# Patient Record
Sex: Male | Born: 1975 | Race: Black or African American | Hispanic: No | Marital: Married | Smoking: Never smoker
Health system: Southern US, Community
[De-identification: ages and names within clinical notes are randomized; demographics above are authoritative.]

## PROBLEM LIST (undated history)

## (undated) DIAGNOSIS — Z87442 Personal history of urinary calculi: Secondary | ICD-10-CM

## (undated) DIAGNOSIS — I10 Essential (primary) hypertension: Secondary | ICD-10-CM

## (undated) HISTORY — DX: Personal history of urinary calculi: Z87.442

## (undated) HISTORY — DX: Essential (primary) hypertension: I10

## (undated) HISTORY — PX: NO PAST SURGERIES: SHX2092

---

## 2008-03-09 ENCOUNTER — Emergency Department (HOSPITAL_COMMUNITY): Admission: EM | Admit: 2008-03-09 | Discharge: 2008-03-09 | Payer: Self-pay | Admitting: Emergency Medicine

## 2010-09-30 ENCOUNTER — Emergency Department (HOSPITAL_COMMUNITY)
Admission: EM | Admit: 2010-09-30 | Discharge: 2010-09-30 | Payer: Self-pay | Source: Home / Self Care | Admitting: Emergency Medicine

## 2010-09-30 LAB — DIFFERENTIAL
Basophils Absolute: 0.1 10*3/uL (ref 0.0–0.1)
Basophils Relative: 1 % (ref 0–1)
Eosinophils Absolute: 0.6 10*3/uL (ref 0.0–0.7)
Eosinophils Relative: 6 % — ABNORMAL HIGH (ref 0–5)
Lymphocytes Relative: 57 % — ABNORMAL HIGH (ref 12–46)
Monocytes Absolute: 0.6 10*3/uL (ref 0.1–1.0)
Monocytes Relative: 6 % (ref 3–12)
Neutro Abs: 3.1 10*3/uL (ref 1.7–7.7)
Neutrophils Relative %: 31 % — ABNORMAL LOW (ref 43–77)

## 2010-09-30 LAB — CBC
HCT: 41.4 % (ref 39.0–52.0)
Hemoglobin: 14.6 g/dL (ref 13.0–17.0)
MCH: 31.9 pg (ref 26.0–34.0)
MCHC: 35.3 g/dL (ref 30.0–36.0)
MCV: 90.4 fL (ref 78.0–100.0)
Platelets: 230 10*3/uL (ref 150–400)
RBC: 4.58 MIL/uL (ref 4.22–5.81)
RDW: 12.7 % (ref 11.5–15.5)
WBC: 10 10*3/uL (ref 4.0–10.5)

## 2010-09-30 LAB — BASIC METABOLIC PANEL
BUN: 13 mg/dL (ref 6–23)
CO2: 27 mEq/L (ref 19–32)
Calcium: 9.9 mg/dL (ref 8.4–10.5)
Chloride: 106 mEq/L (ref 96–112)
Creatinine, Ser: 0.86 mg/dL (ref 0.4–1.5)
GFR calc Af Amer: >60 mL/min (ref >60)
GFR calc non Af Amer: >60 mL/min (ref >60)
Glucose, Bld: 120 mg/dL — ABNORMAL HIGH (ref 70–99)
Potassium: 4.4 mEq/L (ref 3.5–5.1)
Sodium: 139 mEq/L (ref 135–145)

## 2010-09-30 LAB — URINALYSIS, ROUTINE W REFLEX MICROSCOPIC
Bilirubin Urine: NEGATIVE
Ketones, ur: NEGATIVE mg/dL
Leukocytes, UA: NEGATIVE
Nitrite: NEGATIVE
Protein, ur: NEGATIVE mg/dL
Specific Gravity, Urine: 1.013 (ref 1.005–1.030)
Urine Glucose, Fasting: NEGATIVE mg/dL
Urobilinogen, UA: 0.2 mg/dL (ref 0.0–1.0)
pH: 7 (ref 5.0–8.0)

## 2010-09-30 LAB — URINE MICROSCOPIC-ADD ON

## 2011-01-01 ENCOUNTER — Emergency Department (HOSPITAL_COMMUNITY): Payer: Self-pay

## 2011-01-01 ENCOUNTER — Emergency Department (HOSPITAL_COMMUNITY)
Admission: EM | Admit: 2011-01-01 | Discharge: 2011-01-01 | Disposition: A | Discharge status: Home / Self Care | Payer: Self-pay | Attending: Emergency Medicine | Admitting: Emergency Medicine

## 2011-01-01 DIAGNOSIS — I10 Essential (primary) hypertension: Secondary | ICD-10-CM | POA: Insufficient documentation

## 2011-01-01 DIAGNOSIS — N201 Calculus of ureter: Secondary | ICD-10-CM | POA: Insufficient documentation

## 2011-01-01 DIAGNOSIS — R109 Unspecified abdominal pain: Secondary | ICD-10-CM | POA: Insufficient documentation

## 2011-01-01 LAB — URINALYSIS, ROUTINE W REFLEX MICROSCOPIC
Bilirubin Urine: NEGATIVE
Glucose, UA: NEGATIVE mg/dL
Hgb urine dipstick: NEGATIVE
Ketones, ur: NEGATIVE mg/dL
Nitrite: NEGATIVE
Protein, ur: NEGATIVE mg/dL
Specific Gravity, Urine: 1.018 (ref 1.005–1.030)
Urobilinogen, UA: 1 mg/dL (ref 0.0–1.0)
pH: 7 (ref 5.0–8.0)

## 2011-01-01 LAB — DIFFERENTIAL
Basophils Absolute: 0 10*3/uL (ref 0.0–0.1)
Basophils Relative: 0 % (ref 0–1)
Eosinophils Absolute: 0 10*3/uL (ref 0.0–0.7)
Eosinophils Relative: 0 % (ref 0–5)
Lymphocytes Relative: 12 % (ref 12–46)
Lymphs Abs: 2 10*3/uL (ref 0.7–4.0)
Monocytes Absolute: 0.6 10*3/uL (ref 0.1–1.0)
Monocytes Relative: 4 % (ref 3–12)
Neutro Abs: 13.7 10*3/uL — ABNORMAL HIGH (ref 1.7–7.7)
Neutrophils Relative %: 84 % — ABNORMAL HIGH (ref 43–77)

## 2011-01-01 LAB — CBC
HCT: 44 % (ref 39.0–52.0)
Hemoglobin: 15.1 g/dL (ref 13.0–17.0)
MCH: 31.1 pg (ref 26.0–34.0)
MCHC: 34.3 g/dL (ref 30.0–36.0)
MCV: 90.7 fL (ref 78.0–100.0)
Platelets: 230 10*3/uL (ref 150–400)
RBC: 4.85 MIL/uL (ref 4.22–5.81)
RDW: 12.8 % (ref 11.5–15.5)
WBC: 16.4 10*3/uL — ABNORMAL HIGH (ref 4.0–10.5)

## 2011-01-01 LAB — COMPREHENSIVE METABOLIC PANEL
ALT: 35 U/L (ref 0–53)
AST: 39 U/L — ABNORMAL HIGH (ref 0–37)
Albumin: 4.3 g/dL (ref 3.5–5.2)
Alkaline Phosphatase: 61 U/L (ref 39–117)
BUN: 14 mg/dL (ref 6–23)
CO2: 24 mEq/L (ref 19–32)
Calcium: 9.6 mg/dL (ref 8.4–10.5)
Chloride: 104 mEq/L (ref 96–112)
Creatinine, Ser: 0.96 mg/dL (ref 0.4–1.5)
GFR calc Af Amer: >60 mL/min (ref >60)
GFR calc non Af Amer: >60 mL/min (ref >60)
Glucose, Bld: 117 mg/dL — ABNORMAL HIGH (ref 70–99)
Potassium: 5 mEq/L (ref 3.5–5.1)
Sodium: 137 mEq/L (ref 135–145)
Total Bilirubin: 1.2 mg/dL (ref 0.3–1.2)
Total Protein: 7.9 g/dL (ref 6.0–8.3)

## 2011-01-01 LAB — LIPASE, BLOOD: Lipase: 19 U/L (ref 11–59)

## 2011-06-15 ENCOUNTER — Ambulatory Visit (INDEPENDENT_AMBULATORY_CARE_PROVIDER_SITE_OTHER): Payer: PRIVATE HEALTH INSURANCE | Admitting: Internal Medicine

## 2011-06-15 ENCOUNTER — Encounter: Payer: Self-pay | Admitting: Internal Medicine

## 2011-06-15 DIAGNOSIS — I10 Essential (primary) hypertension: Secondary | ICD-10-CM

## 2011-06-15 DIAGNOSIS — N209 Urinary calculus, unspecified: Secondary | ICD-10-CM

## 2011-06-15 LAB — POCT URINALYSIS DIPSTICK
Bilirubin, UA: NEGATIVE
Blood, UA: NEGATIVE
Glucose, UA: NEGATIVE
Ketones, UA: NEGATIVE
Leukocytes, UA: NEGATIVE
Nitrite, UA: NEGATIVE
Protein, UA: NEGATIVE
Spec Grav, UA: 1.025
Urobilinogen, UA: 0.2
pH, UA: 5

## 2011-06-15 LAB — BASIC METABOLIC PANEL
BUN: 11 mg/dL (ref 6–23)
CO2: 24 mEq/L (ref 19–32)
Calcium: 10.1 mg/dL (ref 8.4–10.5)
Chloride: 106 mEq/L (ref 96–112)
Creatinine, Ser: 0.7 mg/dL (ref 0.4–1.5)
GFR: 163.29 mL/min (ref >60.00)
Glucose, Bld: 90 mg/dL (ref 70–99)
Potassium: 4.2 mEq/L (ref 3.5–5.1)
Sodium: 137 mEq/L (ref 135–145)

## 2011-06-15 LAB — PSA: PSA: 0.67 ng/mL (ref 0.10–4.00)

## 2011-06-15 MED ORDER — AMLODIPINE BESYLATE 5 MG PO TABS: 5.0000 mg | ORAL_TABLET | Freq: Every day | ORAL | Status: AC

## 2011-06-15 NOTE — Assessment & Plan Note (Addendum)
Reportedly DBP consistently in the 90s > 1 year rec amlodipine  5 mg, call if develops s/e Long term complications of poorly controlled BP discussed

## 2011-06-15 NOTE — Assessment & Plan Note (Addendum)
See HPI, recent urolithiasis now w/ residual pain. Physical exam normal Plan: Get records UA UCX BMP PSA U/s renal reassess in 6 weeks Sx area atypical ?musculo-skeletal

## 2011-06-15 NOTE — Patient Instructions (Signed)
Get records from urology

## 2011-06-15 NOTE — Progress Notes (Signed)
  Subjective:    Patient ID: Danny Bush, male    DOB: [birth year], 35 y.o.   MRN: [id number]  HPI New patient ~ 03-2011, was seen in the ER   with severe pain, was dx w/ urolithiasis, apparently had some obstruction; then saw a urology , they recommend observation. Eventually the stone passed, went to see urology for a f/u, CT was done, it was clear and pt released. Since then: Has on-off low back pain w/ occ radiation to the L buttock and the L testicle. Pain worse with bending  Also h/o mild but consistent elevated BP, pt reluctant to accept the dx (per wife)  Past Medical History  Diagnosis Date  . Hypertension     mild  . History of kidney stones    Past Surgical History  Procedure Date  . No past surgeries    History   Social History  . Marital Status: Single    Spouse Name: N/A    Number of Children: 1  . Years of Education: N/A   Occupational History  . forklift driver     Social History Main Topics  . Smoking status: Never Smoker   . Smokeless tobacco: Never Used  . Alcohol Use: No  . Drug Use: No  . Sexually Active: Not on file   Other Topics Concern  . Not on file   Social History Narrative   Patient and wife are from  [place]    Family History  Problem Relation Age of Onset  . Diabetes Neg Hx   . Coronary artery disease Neg Hx   . Cancer Neg Hx       Review of Systems No F/C No dysuria , gross hematuria, occ diff urinating occ nausea, no V-D, sometimes constipated  No testicular swelling     Objective:   Physical Exam  Constitutional: He is oriented to person, place, and time. He appears well-developed and well-nourished.  HENT:  Head: Normocephalic and atraumatic.  Cardiovascular: Normal rate, regular rhythm and normal heart sounds.   No murmur heard. Pulmonary/Chest: Effort normal and breath sounds normal. No respiratory distress. He has no wheezes. He has no rales.  Abdominal: Soft. Bowel sounds are normal. He exhibits no distension. There  is no tenderness. There is no rebound.       No CVA tenderness   Genitourinary: Rectum normal and prostate normal.       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 Testcles normal B. No inguinal hernias  Musculoskeletal: He exhibits no edema.  Neurological: He is alert and oriented to person, place, and time.  Psychiatric: He has a normal mood and affect. His behavior is normal. Judgment and thought content normal.          Assessment & Plan:

## 2011-06-17 LAB — CULTURE, URINE COMPREHENSIVE
Colony Count: NO GROWTH
Organism ID, Bacteria: NO GROWTH

## 2011-06-21 ENCOUNTER — Ambulatory Visit
Admission: RE | Admit: 2011-06-21 | Discharge: 2011-06-21 | Disposition: A | Discharge status: Home / Self Care | Payer: PRIVATE HEALTH INSURANCE | Source: Ambulatory Visit | Attending: Internal Medicine | Admitting: Internal Medicine

## 2011-06-21 DIAGNOSIS — N209 Urinary calculus, unspecified: Secondary | ICD-10-CM

## 2014-06-28 ENCOUNTER — Encounter (HOSPITAL_BASED_OUTPATIENT_CLINIC_OR_DEPARTMENT_OTHER): Payer: Self-pay | Admitting: Emergency Medicine

## 2014-06-28 ENCOUNTER — Emergency Department (HOSPITAL_BASED_OUTPATIENT_CLINIC_OR_DEPARTMENT_OTHER): Payer: PRIVATE HEALTH INSURANCE

## 2014-06-28 ENCOUNTER — Emergency Department (HOSPITAL_BASED_OUTPATIENT_CLINIC_OR_DEPARTMENT_OTHER)
Admission: EM | Admit: 2014-06-28 | Discharge: 2014-06-28 | Disposition: A | Discharge status: Home / Self Care | Payer: PRIVATE HEALTH INSURANCE | Attending: Emergency Medicine | Admitting: Emergency Medicine

## 2014-06-28 DIAGNOSIS — I10 Essential (primary) hypertension: Secondary | ICD-10-CM | POA: Diagnosis not present

## 2014-06-28 DIAGNOSIS — Z79899 Other long term (current) drug therapy: Secondary | ICD-10-CM | POA: Diagnosis not present

## 2014-06-28 DIAGNOSIS — N200 Calculus of kidney: Secondary | ICD-10-CM | POA: Diagnosis not present

## 2014-06-28 DIAGNOSIS — R109 Unspecified abdominal pain: Secondary | ICD-10-CM | POA: Diagnosis present

## 2014-06-28 LAB — COMPREHENSIVE METABOLIC PANEL
ALT: 28 U/L (ref 0–53)
AST: 21 U/L (ref 0–37)
Albumin: 4.1 g/dL (ref 3.5–5.2)
Alkaline Phosphatase: 61 U/L (ref 39–117)
Anion gap: 14 (ref 5–15)
BUN: 18 mg/dL (ref 6–23)
CO2: 23 mEq/L (ref 19–32)
Calcium: 11.3 mg/dL — ABNORMAL HIGH (ref 8.4–10.5)
Chloride: 104 mEq/L (ref 96–112)
Creatinine, Ser: 1 mg/dL (ref 0.50–1.35)
GFR calc Af Amer: >90 mL/min (ref >90)
GFR calc non Af Amer: >90 mL/min (ref >90)
Glucose, Bld: 131 mg/dL — ABNORMAL HIGH (ref 70–99)
Potassium: 3.8 mEq/L (ref 3.7–5.3)
Sodium: 141 mEq/L (ref 137–147)
Total Bilirubin: 0.3 mg/dL (ref 0.3–1.2)
Total Protein: 7.9 g/dL (ref 6.0–8.3)

## 2014-06-28 LAB — URINALYSIS, ROUTINE W REFLEX MICROSCOPIC
Bilirubin Urine: NEGATIVE
Glucose, UA: NEGATIVE mg/dL
Ketones, ur: NEGATIVE mg/dL
Leukocytes, UA: NEGATIVE
Nitrite: NEGATIVE
Protein, ur: NEGATIVE mg/dL
Specific Gravity, Urine: 1.014 (ref 1.005–1.030)
Urobilinogen, UA: 1 mg/dL (ref 0.0–1.0)
pH: 6.5 (ref 5.0–8.0)

## 2014-06-28 LAB — CBC
HCT: 38.4 % — ABNORMAL LOW (ref 39.0–52.0)
Hemoglobin: 14.1 g/dL (ref 13.0–17.0)
MCH: 31.8 pg (ref 26.0–34.0)
MCHC: 36.7 g/dL — ABNORMAL HIGH (ref 30.0–36.0)
MCV: 86.5 fL (ref 78.0–100.0)
Platelets: 219 10*3/uL (ref 150–400)
RBC: 4.44 MIL/uL (ref 4.22–5.81)
RDW: 12.1 % (ref 11.5–15.5)
WBC: 12 10*3/uL — ABNORMAL HIGH (ref 4.0–10.5)

## 2014-06-28 LAB — URINE MICROSCOPIC-ADD ON

## 2014-06-28 MED ORDER — HYDROCODONE-ACETAMINOPHEN 5-325 MG PO TABS: 1.0000 | ORAL_TABLET | Freq: Four times a day (QID) | ORAL | Status: AC | PRN

## 2014-06-28 MED ORDER — ONDANSETRON HCL 4 MG/2ML IJ SOLN
4.0000 mg | Freq: Once | INTRAMUSCULAR | Status: AC
  Administered 2014-06-28: 4 mg via INTRAVENOUS
  Filled 2014-06-28: qty 2

## 2014-06-28 MED ORDER — HYDROMORPHONE HCL 1 MG/ML IJ SOLN
1.0000 mg | Freq: Once | INTRAMUSCULAR | Status: AC
  Administered 2014-06-28: 1 mg via INTRAVENOUS
  Filled 2014-06-28: qty 1

## 2014-06-28 MED ORDER — TAMSULOSIN HCL 0.4 MG PO CAPS: 0.4000 mg | ORAL_CAPSULE | Freq: Every day | ORAL | Status: DC

## 2014-06-28 MED ORDER — KETOROLAC TROMETHAMINE 30 MG/ML IJ SOLN
30.0000 mg | Freq: Once | INTRAMUSCULAR | Status: AC
  Administered 2014-06-28: 30 mg via INTRAVENOUS
  Filled 2014-06-28: qty 1

## 2014-06-28 MED ORDER — SODIUM CHLORIDE 0.9 % IV BOLUS (SEPSIS)
1000.0000 mL | Freq: Once | INTRAVENOUS | Status: AC
  Administered 2014-06-28: 1000 mL via INTRAVENOUS

## 2014-06-28 NOTE — Discharge Instructions (Signed)

## 2014-06-28 NOTE — ED Provider Notes (Signed)
CSN: [id number]     Arrival date & time 06/28/14  1547 History  This chart was scribed for Elwin MochaBlair Cerenity Goshorn, MD by Evon Slackerrance Branch, ED Scribe. This patient was seen in room MH12/MH12 and the patient's care was started at 3:52 PM.     Chief Complaint  Patient presents with  . Abdominal Pain  . Groin Pain  . Testicle Pain   Patient is a 38 y.o. male presenting with abdominal pain, groin pain, and testicular pain. The history is provided by the patient. No language interpreter was used.  Abdominal Pain Pain location:  R flank Pain radiates to:  Scrotum Pain severity:  Severe Onset quality:  Sudden Duration:  1 hour Timing:  Constant Relieved by:  None tried Worsened by:  Nothing tried Ineffective treatments:  None tried Associated symptoms: dysuria   Groin Pain Associated symptoms include abdominal pain.  Testicle Pain Associated symptoms include abdominal pain.   HPI Comments: Danny Bush is a 38 y.o. male who presents to the Emergency Department complaining of constant abdominal pain onset 1 hour prior. He states that the pain radiates down into his right testicle. He states he has associated back pain and dysuria. He states he tried to urinate on arrival but was unable to urinate due to the pain.  He states he has had similar pain 1 year ago and was diagnosed with a kidney stone.   Past Medical History  Diagnosis Date  . Hypertension     mild  . History of kidney stones    Past Surgical History  Procedure Laterality Date  . No past surgeries     Family History  Problem Relation Age of Onset  . Diabetes Neg Hx   . Coronary artery disease Neg Hx   . Cancer Neg Hx    History  Substance Use Topics  . Smoking status: Never Smoker   . Smokeless tobacco: Never Used  . Alcohol Use: No    Review of Systems  Gastrointestinal: Positive for abdominal pain.  Genitourinary: Positive for dysuria and testicular pain.  Musculoskeletal: Positive for back pain.  All other systems  reviewed and are negative.  Allergies  Review of patient's allergies indicates no known allergies.  Home Medications   Prior to Admission medications   Medication Sig Start Date End Date Taking? Authorizing Provider  amLODipine (NORVASC) 5 MG tablet Take 1 tablet (5 mg total) by mouth daily. 06/15/11 06/14/12  Wanda PlumpJose E Paz, MD   Triage Vitals: BP 183/119  Pulse 85  Temp(Src) 98.7 F (37.1 C) (Oral)  Resp 20  SpO2 100%   Physical Exam  Nursing note and vitals reviewed. Constitutional: He is oriented to person, place, and time. He appears well-developed and well-nourished.  Patient writhing around in pain  HENT:  Head: Normocephalic and atraumatic.  Right Ear: External ear normal.  Left Ear: External ear normal.  Nose: Nose normal.  Mouth/Throat: Oropharynx is clear and moist.  Eyes: Conjunctivae and EOM are normal. Pupils are equal, round, and reactive to light.  Neck: Normal range of motion. Neck supple.  Cardiovascular: Normal rate, regular rhythm, normal heart sounds and intact distal pulses.   Pulmonary/Chest: Effort normal and breath sounds normal. No respiratory distress. He has no wheezes.  Abdominal: Soft. Bowel sounds are normal. He exhibits no distension. There is tenderness (R flank, RL:Q). There is no rebound and no guarding. Hernia confirmed negative in the right inguinal area and confirmed negative in the left inguinal area.  Genitourinary: Right testis shows  tenderness (very mild). Right testis shows no mass and no swelling. Left testis shows no mass, no swelling and no tenderness.  Musculoskeletal: Normal range of motion.  Lymphadenopathy:       Right: No inguinal adenopathy present.       Left: No inguinal adenopathy present.  Neurological: He is alert and oriented to person, place, and time. He has normal reflexes.  Skin: Skin is warm and dry. No rash noted.  Psychiatric: He has a normal mood and affect. His behavior is normal. Judgment and thought content  normal.    ED Course  Procedures (including critical care time) DIAGNOSTIC STUDIES: Oxygen Saturation is 100% on RA, normal by my interpretation.    COORDINATION OF CARE: 3:59 PM-Discussed treatment plan which includes pain medication, IV fluid and zofran with pt at bedside and pt agreed to plan.     Labs Review Labs Reviewed - No data to display  Imaging Review Ct Renal Stone Study  06/28/2014   CLINICAL DATA:  Right flank and scrotal pain for 2 hr. Dysuria. No history of trauma.  EXAM: CT ABDOMEN AND PELVIS WITHOUT CONTRAST  TECHNIQUE: Multidetector CT imaging of the abdomen and pelvis was performed following the standard protocol without IV contrast.  COMPARISON:  CT abdomen and pelvis 01/01/2011 and 09/30/2010.  FINDINGS: Two subcentimeter nodules are seen along the left major fissure on image 12, unchanged. There is also some dependent atelectasis in the bases. No pleural or pericardial effusion.  There is mild stranding about the right ureter and very mild right hydronephrosis due to a 0.2 cm stone at the right UVJ. No other urinary tract stones are seen on the right or left.  The gallbladder, liver, spleen, adrenal glands, pancreas and biliary tree appear normal. Small umbilical hernia contains a knuckle of small bowel without obstruction or other complicating feature. The small bowel is otherwise unremarkable. The stomach, colon and appendix appear normal. No lymphadenopathy or fluid. No focal bony abnormality.  IMPRESSION: Mild right hydronephrosis due to a 0.2 cm stone at the right UVJ.  Small umbilical hernia contains a knuckle of small bowel without obstruction or other complicating feature.   Electronically Signed   By: Drusilla Kanner M.D.   On: 06/28/2014 16:40     EKG Interpretation None      MDM   Final diagnoses:  Kidney stone   34M with acute onset of R flank pain. Off and on this morning, severe episode starting today. Hx of large kidney stones, feels like similar.   No vomiting, no fevers. On exam, R flank pain. GU exam with very mild R testicular pain. Doubt torsion as he said pain starts in the abdomen and radiates down into the R testicle and has hx of kidney stones.  Will start with labs, CT scan, fluids, pain meds. Scan shows kidney stone - 2mm. Stable for discharge.  I have reviewed all labs and imaging and considered them in my medical decision making.   I personally performed the services described in this documentation, which was scribed in my presence. The recorded information has been reviewed and is accurate.        Elwin Mocha, MD 06/28/14 [phone]

## 2014-06-28 NOTE — ED Notes (Signed)
Patient presents to ED with complaints of rt lower quadrant pain groin pain testicular pain and penis pain that started 2 hours ago. Pt thrashing around during triage, laying in floor.

## 2015-06-22 IMAGING — CT CT RENAL STONE PROTOCOL
2 of 4 series · 15 of 46 positions shown, 17 images · non-contrast
Comparison: CT abdomen and pelvis 01/01/2011 and 09/30/2010.

CLINICAL DATA: Right flank and scrotal pain for 2 hr. Dysuria. No
history of trauma.

EXAM:
CT ABDOMEN AND PELVIS WITHOUT CONTRAST
TECHNIQUE: Multidetector CT imaging of the abdomen and pelvis was performed
following the standard protocol without IV contrast.

[Series 2: renal stone < 200 lbs 5.0 b31f · axial · 0.76mm/px · z∈[-516,-36]mm · 12 of 106 slices shown, 14 images]
[im 5/106  soft-tissue]
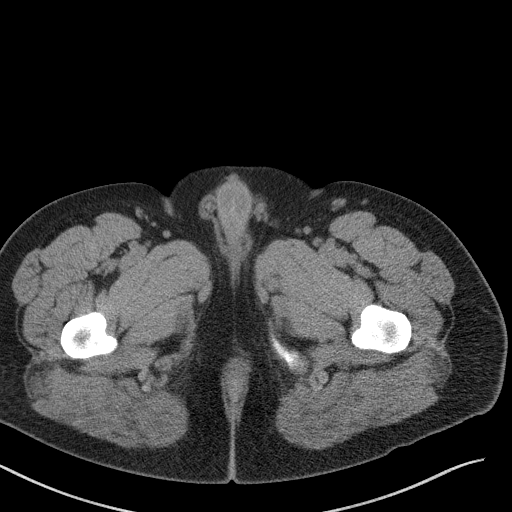
[im 5/106  bone]
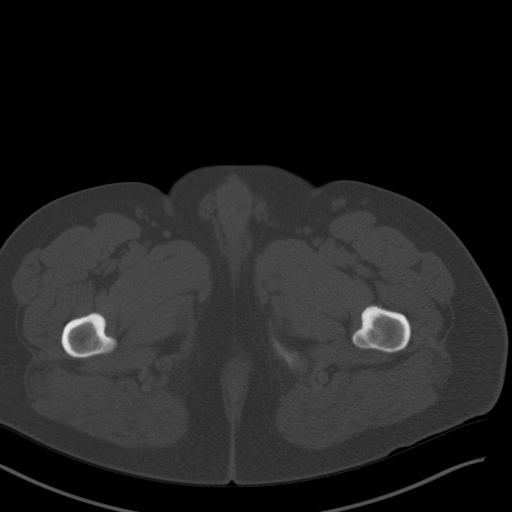
[im 14/106  soft-tissue]
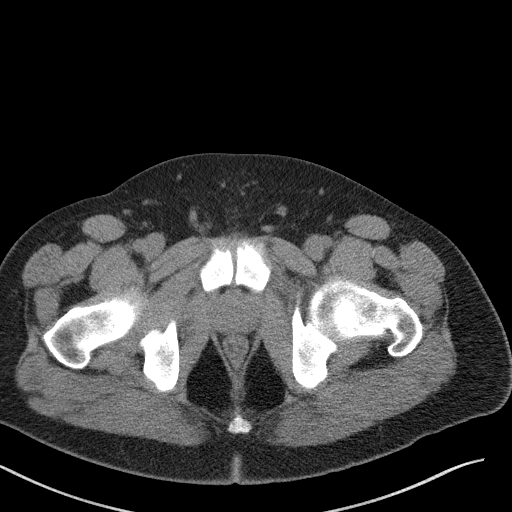
[im 22/106  soft-tissue]
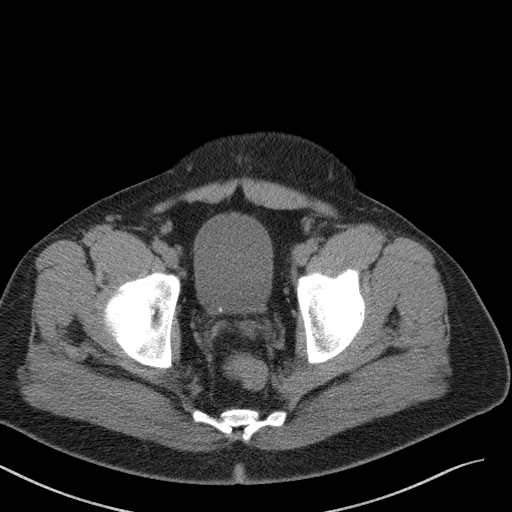
[im 31/106  soft-tissue]
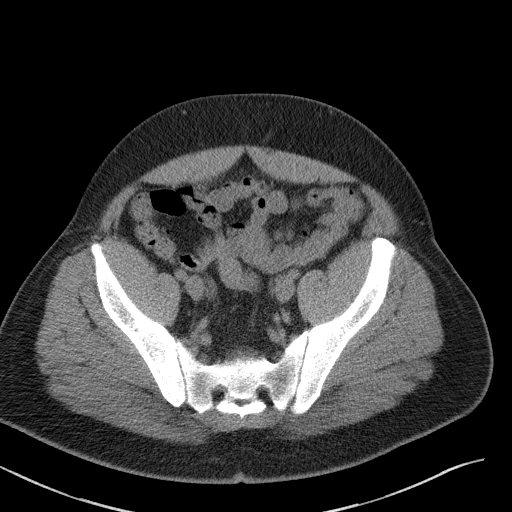
[im 40/106  soft-tissue]
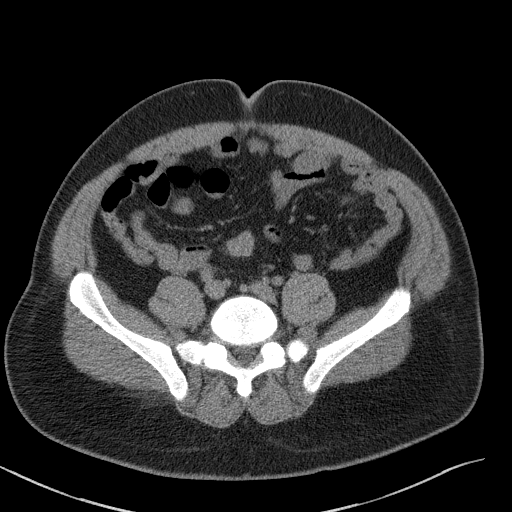
[im 49/106  soft-tissue]
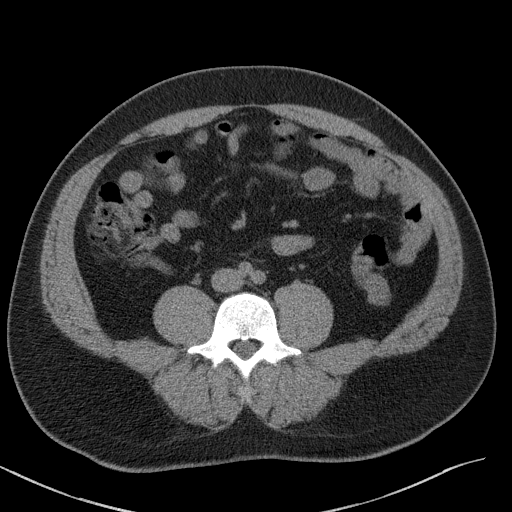
[im 57/106  soft-tissue]
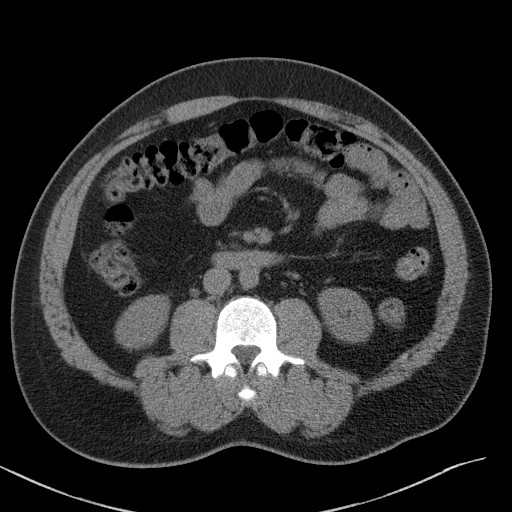
[im 66/106  soft-tissue]
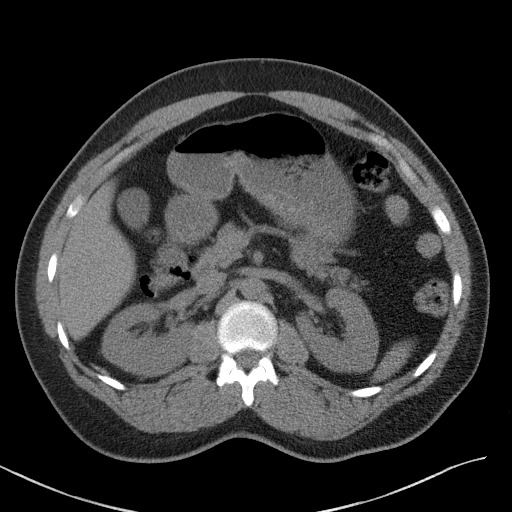
[im 75/106  soft-tissue]
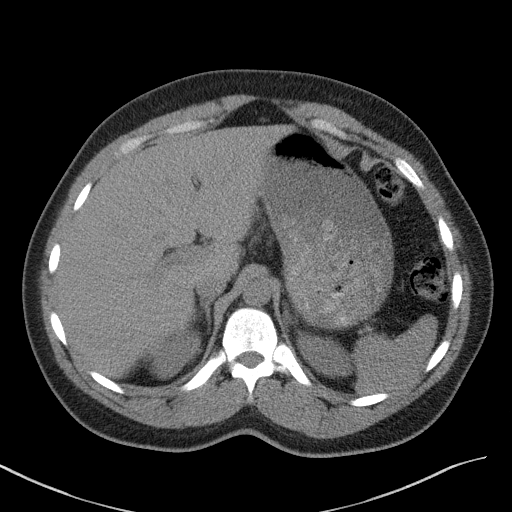
[im 75/106  bone]
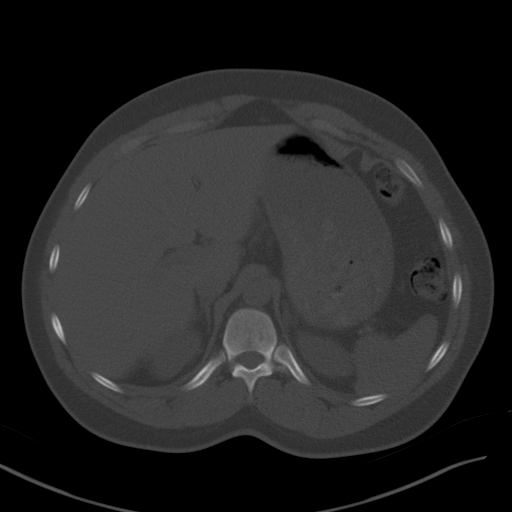
[im 84/106  soft-tissue]
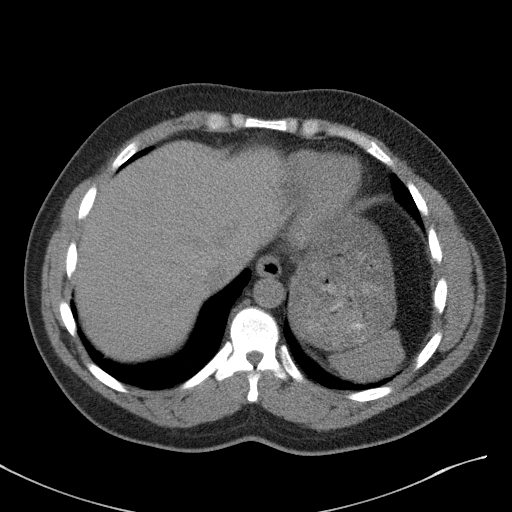
[im 92/106  soft-tissue]
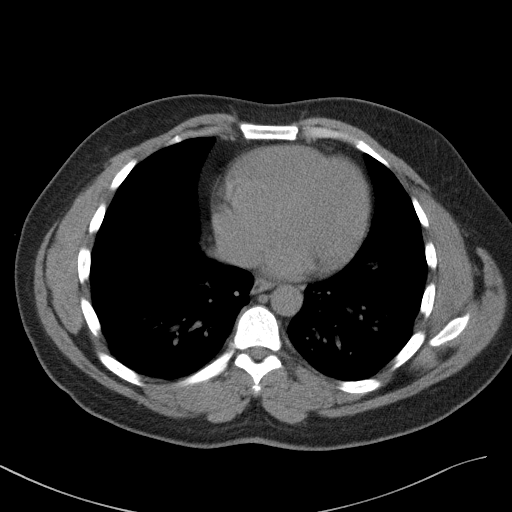
[im 101/106  soft-tissue]
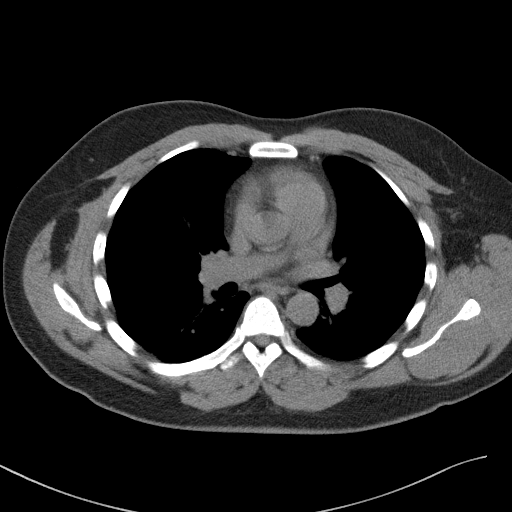

[Series 5: renal stone 3.0 coronal · coronal · 0.59mm/px · 3 of 73 slices shown]
[im 25/73  soft-tissue]
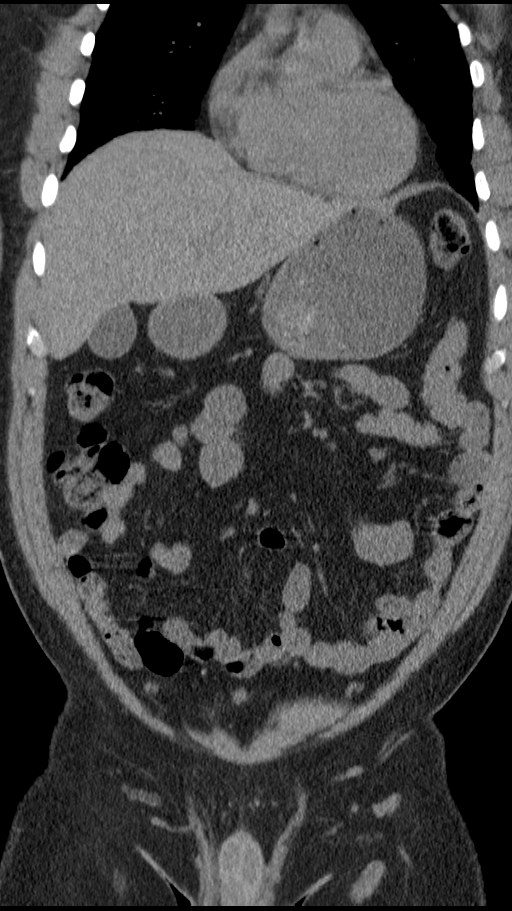
[im 33/73  soft-tissue]
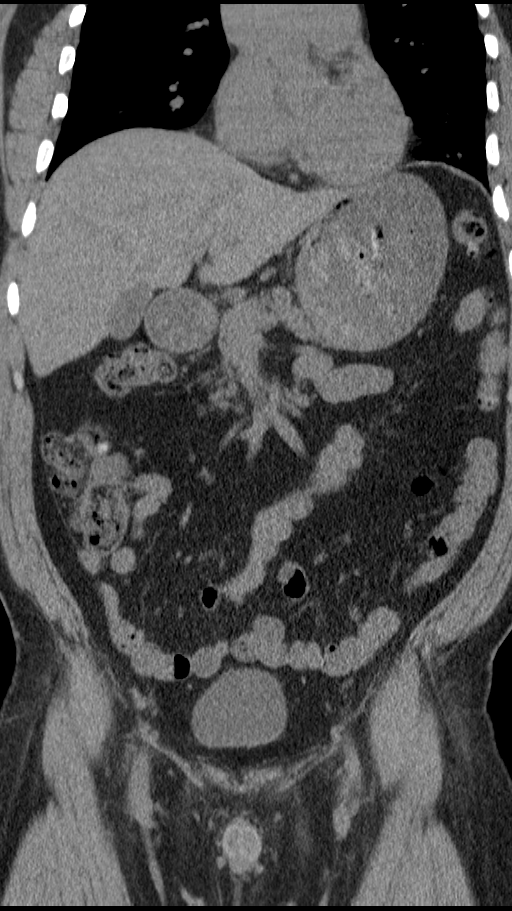
[im 41/73  soft-tissue]
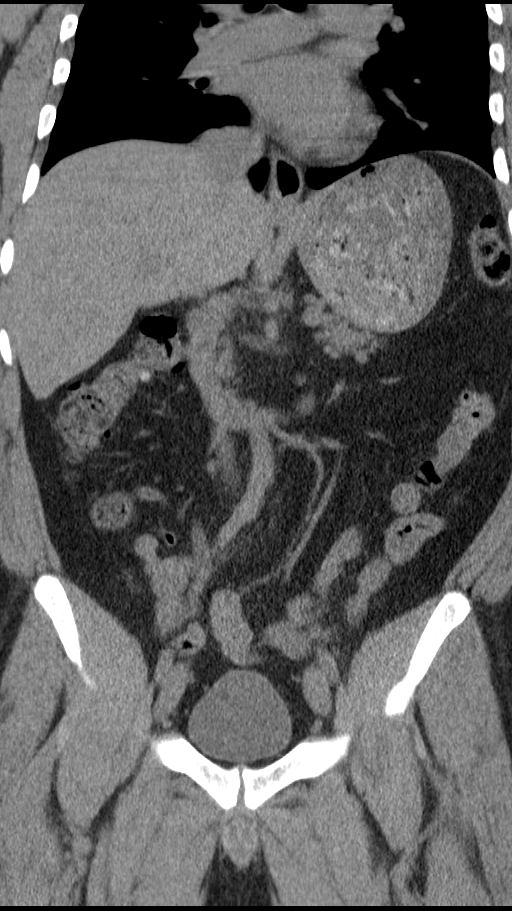

[15 of 46 positions shown; findings below may reference images not displayed]

FINDINGS: Two subcentimeter nodules are seen along the left major fissure on
image 12, unchanged. There is also some dependent atelectasis in the
bases. No pleural or pericardial effusion.

There is mild stranding about the right ureter and very mild right
hydronephrosis due to a 0.2 cm stone at the right UVJ. No other
urinary tract stones are seen on the right or left.

The gallbladder, liver, spleen, adrenal glands, pancreas and biliary
tree appear normal. Small umbilical hernia contains a knuckle of
small bowel without obstruction or other complicating feature. The
small bowel is otherwise unremarkable. The stomach, colon and
appendix appear normal. No lymphadenopathy or fluid. No focal bony
abnormality.
IMPRESSION: Mild right hydronephrosis due to a 0.2 cm stone at the right UVJ.

Small umbilical hernia contains a knuckle of small bowel without
obstruction or other complicating feature.

## 2019-04-17 DIAGNOSIS — E210 Primary hyperparathyroidism: Secondary | ICD-10-CM | POA: Diagnosis not present

## 2019-04-17 DIAGNOSIS — E559 Vitamin D deficiency, unspecified: Secondary | ICD-10-CM | POA: Diagnosis not present

## 2024-09-09 ENCOUNTER — Other Ambulatory Visit: Payer: Self-pay

## 2024-09-09 ENCOUNTER — Emergency Department (HOSPITAL_BASED_OUTPATIENT_CLINIC_OR_DEPARTMENT_OTHER)
Admission: EM | Admit: 2024-09-09 | Discharge: 2024-09-10 | Disposition: A | Payer: PRIVATE HEALTH INSURANCE | Attending: Emergency Medicine | Admitting: Emergency Medicine

## 2024-09-09 DIAGNOSIS — R10A1 Flank pain, right side: Secondary | ICD-10-CM | POA: Diagnosis present

## 2024-09-09 DIAGNOSIS — N23 Unspecified renal colic: Secondary | ICD-10-CM | POA: Diagnosis not present

## 2024-09-09 LAB — CBC
HCT: 39.5 % (ref 39.0–52.0)
Hemoglobin: 14 g/dL (ref 13.0–17.0)
MCH: 30.6 pg (ref 26.0–34.0)
MCHC: 35.4 g/dL (ref 30.0–36.0)
MCV: 86.4 fL (ref 80.0–100.0)
Platelets: 212 K/uL (ref 150–400)
RBC: 4.57 MIL/uL (ref 4.22–5.81)
RDW: 12.6 % (ref 11.5–15.5)
WBC: 9.8 K/uL (ref 4.0–10.5)
nRBC: 0 % (ref 0.0–0.2)

## 2024-09-09 LAB — BASIC METABOLIC PANEL WITH GFR
Anion gap: 13 (ref 5–15)
BUN: 18 mg/dL (ref 6–20)
CO2: 23 mmol/L (ref 22–32)
Calcium: 9.2 mg/dL (ref 8.9–10.3)
Chloride: 104 mmol/L (ref 98–111)
Creatinine, Ser: 0.99 mg/dL (ref 0.61–1.24)
GFR, Estimated: >60 mL/min (ref >60)
Glucose, Bld: 114 mg/dL — ABNORMAL HIGH (ref 70–99)
Potassium: 3.9 mmol/L (ref 3.5–5.1)
Sodium: 140 mmol/L (ref 135–145)

## 2024-09-09 MED ORDER — KETOROLAC TROMETHAMINE 30 MG/ML IJ SOLN: 30.0000 mg | Freq: Once | INTRAMUSCULAR | Status: DC

## 2024-09-09 MED ORDER — HYDROMORPHONE HCL 1 MG/ML IJ SOLN
1.0000 mg | Freq: Once | INTRAMUSCULAR | Status: AC
  Administered 2024-09-10: 1 mg via INTRAVENOUS
  Filled 2024-09-09: qty 1

## 2024-09-09 MED ORDER — KETOROLAC TROMETHAMINE 30 MG/ML IJ SOLN
15.0000 mg | Freq: Once | INTRAMUSCULAR | Status: AC
  Administered 2024-09-09: 21:00:00 15 mg via INTRAVENOUS
  Filled 2024-09-09: qty 1

## 2024-09-09 NOTE — ED Provider Notes (Signed)
 [facility] Provider Note   CSN: [id number] Arrival date & time: 09/09/24  2035     Patient presents with: Flank Pain   Danny Bush is a 48 y.o. male.  {Add pertinent medical, surgical, social history, OB history to YEP:67052} The history is provided by the patient.  Flank Pain  Danny Bush is a 48 y.o. male who presents to the Emergency Department complaining of *** 5p right flank pain, worsening.  Sneezing this morning.  Pain comes and goes.  Radiates to testicle.  No fever. No v.  Has dysuria.  Hx/o kidney stones, thyroidectomy     Prior to Admission medications  Medication Sig Start Date End Date Taking? Authorizing Provider  amLODipine  (NORVASC ) 5 MG tablet Take 1 tablet (5 mg total) by mouth daily. 06/15/11 06/14/12  Paz, Jose E, MD  HYDROcodone -acetaminophen  (NORCO/VICODIN) 5-325 MG per tablet Take 1 tablet by mouth every 6 (six) hours as needed for moderate pain. 06/28/14   Elpidio Lamp, MD  tamsulosin  (FLOMAX ) 0.4 MG CAPS capsule Take 1 capsule (0.4 mg total) by mouth daily. 06/28/14   Elpidio Lamp, MD    Allergies: Patient has no known allergies.    Review of Systems  Genitourinary:  Positive for flank pain.  All other systems reviewed and are negative.   Updated Vital Signs BP (!) 164/102 (BP Location: Left Arm)   Pulse 74   Temp 98.7 F (37.1 C) (Oral)   Resp 19   Ht 6' 2 (1.88 m)   Wt 104.3 kg   SpO2 96%   BMI 29.53 kg/m   Physical Exam Vitals and nursing note reviewed.  Constitutional:      Appearance: He is well-developed.  HENT:     Head: Normocephalic and atraumatic.  Cardiovascular:     Rate and Rhythm: Normal rate and regular rhythm.  Pulmonary:     Effort: Pulmonary effort is normal. No respiratory distress.  Abdominal:     Palpations: Abdomen is soft.     Tenderness: There is no abdominal tenderness. There is no guarding or rebound.  Musculoskeletal:        General: No tenderness.   Skin:    General: Skin is warm and dry.  Neurological:     Mental Status: He is alert and oriented to person, place, and time.  Psychiatric:        Behavior: Behavior normal.     (all labs ordered are listed, but only abnormal results are displayed) Labs Reviewed  BASIC METABOLIC PANEL WITH GFR - Abnormal; Notable for the following components:      Result Value   Glucose, Bld 114 (*)    All other components within normal limits  CBC  URINALYSIS, ROUTINE W REFLEX MICROSCOPIC  URINALYSIS, W/ REFLEX TO CULTURE (INFECTION SUSPECTED)    EKG: None  Radiology: No results found.  {Document cardiac monitor, telemetry assessment procedure when appropriate:32947} Procedures   Medications Ordered in the ED  ketorolac  (TORADOL ) 30 MG/ML injection 15 mg (15 mg Intravenous Given 09/09/24 2118)      {Click here for ABCD2, HEART and other calculators REFRESH Note before signing:1}                              Medical Decision Making Amount and/or Complexity of Data Reviewed Labs: ordered.   ***  {Document critical care time when appropriate  Document review of labs and clinical decision tools ie  CHADS2VASC2, etc  Document your independent review of radiology images and any outside records  Document your discussion with family members, caretakers and with consultants  Document social determinants of health affecting pt's care  Document your decision making why or why not admission, treatments were needed:32947:::1}   Final diagnoses:  None    ED Discharge Orders     None

## 2024-09-09 NOTE — ED Triage Notes (Signed)
 Patient reports right sided flank pain that goes down to groin that started around 1700 today. History of kidney stones

## 2024-09-10 ENCOUNTER — Emergency Department (HOSPITAL_BASED_OUTPATIENT_CLINIC_OR_DEPARTMENT_OTHER): Payer: PRIVATE HEALTH INSURANCE

## 2024-09-10 LAB — URINALYSIS, W/ REFLEX TO CULTURE (INFECTION SUSPECTED)
Bilirubin Urine: NEGATIVE
Glucose, UA: NEGATIVE mg/dL
Ketones, ur: NEGATIVE mg/dL
Leukocytes,Ua: NEGATIVE
Nitrite: NEGATIVE
Protein, ur: NEGATIVE mg/dL
RBC / HPF: >50 RBC/hpf (ref 0–5)
Specific Gravity, Urine: >=1.03 (ref 1.005–1.030)
pH: 6 (ref 5.0–8.0)

## 2024-09-10 LAB — HEPATIC FUNCTION PANEL
ALT: 32 U/L (ref 0–44)
AST: 25 U/L (ref 15–41)
Albumin: 4.6 g/dL (ref 3.5–5.0)
Alkaline Phosphatase: 73 U/L (ref 38–126)
Bilirubin, Direct: 0.1 mg/dL (ref 0.0–0.2)
Indirect Bilirubin: 0.3 mg/dL (ref 0.3–0.9)
Total Bilirubin: 0.4 mg/dL (ref 0.0–1.2)
Total Protein: 7.5 g/dL (ref 6.5–8.1)

## 2024-09-10 MED ORDER — TAMSULOSIN HCL 0.4 MG PO CAPS: 0.4000 mg | ORAL_CAPSULE | Freq: Every day | ORAL | 0 refills | Status: AC

## 2024-09-10 MED ORDER — OXYCODONE-ACETAMINOPHEN 5-325 MG PO TABS: 1.0000 | ORAL_TABLET | Freq: Four times a day (QID) | ORAL | 0 refills | Status: AC | PRN

## 2024-09-10 NOTE — Discharge Instructions (Addendum)
 Get rechecked if you develop fevers, cannot pee, have uncontrolled pain or new concerning symptoms.

## 2024-09-10 NOTE — ED Notes (Signed)
